# Patient Record
Sex: Female | Born: 1995 | Race: Black or African American | Hispanic: No | Marital: Single | Smoking: Never smoker
Health system: Southern US, Community
[De-identification: ages and names within clinical notes are randomized; demographics above are authoritative.]

---

## 2019-06-10 ENCOUNTER — Emergency Department (HOSPITAL_COMMUNITY)
Admission: EM | Admit: 2019-06-10 | Discharge: 2019-06-10 | Disposition: A | Discharge status: Home / Self Care | Payer: Self-pay | Attending: Emergency Medicine | Admitting: Emergency Medicine

## 2019-06-10 ENCOUNTER — Other Ambulatory Visit: Payer: Self-pay

## 2019-06-10 DIAGNOSIS — N898 Other specified noninflammatory disorders of vagina: Secondary | ICD-10-CM | POA: Insufficient documentation

## 2019-06-10 LAB — WET PREP, GENITAL
Clue Cells Wet Prep HPF POC: NONE SEEN
Sperm: NONE SEEN
Trich, Wet Prep: NONE SEEN
Yeast Wet Prep HPF POC: NONE SEEN

## 2019-06-10 LAB — POC URINE PREG, ED: Preg Test, Ur: NEGATIVE

## 2019-06-10 NOTE — ED Provider Notes (Signed)
[facility] EMERGENCY DEPARTMENT Provider Note   CSN: [id number] Arrival date & time: 06/10/19  1349    History   Chief Complaint No chief complaint on file.   HPI Ashley Hays is a 23 y.o. female.     23yo female with vaginal dc x 2 weeks, odor for the past few days. LMP 1 month ago. Denies pelvic pain. Patient has a history of prior STI, possibly trich within the last 6 months. Patient is sexually active, not on birth control, does not use condoms.      No past medical history on file.  There are no active problems to display for this patient.  OB History   No obstetric history on file.      Home Medications    Prior to Admission medications   Not on File    Family History No family history on file.  Social History Social History   Tobacco Use  . Smoking status: Not on file  Substance Use Topics  . Alcohol use: Not on file  . Drug use: Not on file     Allergies   Patient has no known allergies.   Review of Systems Review of Systems  Constitutional: Negative for fever.  Gastrointestinal: Negative for abdominal pain, constipation, diarrhea, nausea and vomiting.  Genitourinary: Positive for vaginal discharge. Negative for dysuria, frequency, pelvic pain, urgency, vaginal bleeding and vaginal pain.  Skin: Negative for rash and wound.  Allergic/Immunologic: Negative for immunocompromised state.  Neurological: Negative for weakness.  Psychiatric/Behavioral: Negative for confusion.  All other systems reviewed and are negative.    Physical Exam Updated Vital Signs BP (!) 110/91 (BP Location: Right Arm)   Pulse 99   Temp 98.9 F (37.2 C) (Oral)   Resp 14   SpO2 100%   Physical Exam Vitals signs and nursing note reviewed. Exam conducted with a chaperone present.  Constitutional:      General: She is not in acute distress.    Appearance: She is well-developed. She is not diaphoretic.  HENT:     Head: Normocephalic and  atraumatic.  Cardiovascular:     Rate and Rhythm: Normal rate and regular rhythm.     Pulses: Normal pulses.     Heart sounds: Normal heart sounds.  Pulmonary:     Effort: Pulmonary effort is normal.     Breath sounds: Normal breath sounds.  Abdominal:     Palpations: Abdomen is soft.     Tenderness: There is no abdominal tenderness.  Genitourinary:    General: Normal vulva.     Vagina: Normal. No vaginal discharge or tenderness.     Cervix: No cervical motion tenderness, discharge, friability or erythema.     Uterus: Normal. Not enlarged and not tender.      Adnexa: Right adnexa normal and left adnexa normal.       Right: No mass, tenderness or fullness.         Left: No mass, tenderness or fullness.    Skin:    General: Skin is warm and dry.     Findings: No erythema or rash.  Neurological:     Mental Status: She is alert and oriented to person, place, and time.  Psychiatric:        Behavior: Behavior normal.      ED Treatments / Results  Labs (all labs ordered are listed, but only abnormal results are displayed) Labs Reviewed  WET PREP, GENITAL - Abnormal; Notable for the following components:  Result Value   WBC, Wet Prep HPF POC FEW (*)    All other components within normal limits  POC URINE PREG, ED  GC/CHLAMYDIA PROBE AMP ([place]) NOT AT [facility]    EKG None  Radiology No results found.  Procedures Procedures (including critical care time)  Medications Ordered in ED Medications - No data to display   Initial Impression / Assessment and Plan / ED Course  I have reviewed the triage vital signs and the nursing notes.  Pertinent labs & imaging results that were available during my care of the patient were reviewed by me and considered in my medical decision making (see chart for details).  Clinical Course as of Jun 09 1542  Tue Jun 10, 2019  [id number] 23-year-old female with complaint of vaginal discharge with odor.  Pelvic exam is unremarkable.  Wet  prep negative for trichomoniasis, BV, yeast.  Pregnancy test is negative.  Gonorrhea chlamydia testing sent out, patient was advised to contact for results, follow-up with health department if she needs treatment.   [LM]    Clinical Course User Index [LM] Jeannie FendMurphy, Quay Simkin A, PA-C      Final Clinical Impressions(s) / ED Diagnoses   Final diagnoses:  Vaginal discharge    ED Discharge Orders    None       Alden HippMurphy, Shatoria Stooksbury A, PA-C 06/10/19 1543    Arby BarrettePfeiffer, Marcy, MD 06/15/19 [phone]

## 2019-06-10 NOTE — ED Triage Notes (Signed)
Pt here with c/o vaginal discharge , no bleeding noted , no frequency or burning on urination , pt states that she has had 5 negative pernancy test

## 2019-06-10 NOTE — Discharge Instructions (Addendum)
Abstain from intercourse until you know your test results. Call the hospital in 3-5 days for your results. IF your gonorrhea and chlamydia tests are negative, you may resume intercourse. IF either test is positive, your partner will need to go to the health department for free treatment. IF your test is positive, both partners need to abstain from intercourse for 10 days after treatment (this includes all forms of intercourse).  IF your test is positive, you should go to the health department for treatment.

## 2019-06-11 LAB — GC/CHLAMYDIA PROBE AMP (~~LOC~~) NOT AT ARMC
Chlamydia: NEGATIVE
Neisseria Gonorrhea: NEGATIVE

## 2021-06-16 DIAGNOSIS — Z8349 Family history of other endocrine, nutritional and metabolic diseases: Secondary | ICD-10-CM | POA: Insufficient documentation

## 2021-09-12 ENCOUNTER — Emergency Department (HOSPITAL_COMMUNITY): Payer: Medicaid Other

## 2021-09-12 ENCOUNTER — Emergency Department (HOSPITAL_COMMUNITY)
Admission: EM | Admit: 2021-09-12 | Discharge: 2021-09-12 | Disposition: A | Discharge status: Home / Self Care | Payer: Medicaid Other | Attending: Emergency Medicine | Admitting: Emergency Medicine

## 2021-09-12 ENCOUNTER — Other Ambulatory Visit: Payer: Self-pay

## 2021-09-12 DIAGNOSIS — O26891 Other specified pregnancy related conditions, first trimester: Secondary | ICD-10-CM

## 2021-09-12 DIAGNOSIS — R1032 Left lower quadrant pain: Secondary | ICD-10-CM | POA: Diagnosis not present

## 2021-09-12 DIAGNOSIS — O209 Hemorrhage in early pregnancy, unspecified: Secondary | ICD-10-CM | POA: Diagnosis present

## 2021-09-12 DIAGNOSIS — Z3A01 Less than 8 weeks gestation of pregnancy: Secondary | ICD-10-CM | POA: Diagnosis not present

## 2021-09-12 DIAGNOSIS — R102 Pelvic and perineal pain: Secondary | ICD-10-CM | POA: Insufficient documentation

## 2021-09-12 LAB — CBC WITH DIFFERENTIAL/PLATELET
Abs Immature Granulocytes: 0.01 10*3/uL (ref 0.00–0.07)
Basophils Absolute: 0 10*3/uL (ref 0.0–0.1)
Basophils Relative: 1 %
Eosinophils Absolute: 0.2 10*3/uL (ref 0.0–0.5)
Eosinophils Relative: 3 %
HCT: 36.7 % (ref 36.0–46.0)
Hemoglobin: 12 g/dL (ref 12.0–15.0)
Immature Granulocytes: 0 %
Lymphocytes Relative: 38 %
Lymphs Abs: 2.1 10*3/uL (ref 0.7–4.0)
MCH: 27.4 pg (ref 26.0–34.0)
MCHC: 32.7 g/dL (ref 30.0–36.0)
MCV: 83.8 fL (ref 80.0–100.0)
Monocytes Absolute: 0.4 10*3/uL (ref 0.1–1.0)
Monocytes Relative: 7 %
Neutro Abs: 2.7 10*3/uL (ref 1.7–7.7)
Neutrophils Relative %: 51 %
Platelets: 177 10*3/uL (ref 150–400)
RBC: 4.38 MIL/uL (ref 3.87–5.11)
RDW: 12.5 % (ref 11.5–15.5)
WBC: 5.3 10*3/uL (ref 4.0–10.5)
nRBC: 0 % (ref 0.0–0.2)

## 2021-09-12 LAB — COMPREHENSIVE METABOLIC PANEL
ALT: 9 U/L (ref 0–44)
AST: 18 U/L (ref 15–41)
Albumin: 4.3 g/dL (ref 3.5–5.0)
Alkaline Phosphatase: 47 U/L (ref 38–126)
Anion gap: 6 (ref 5–15)
BUN: 10 mg/dL (ref 6–20)
CO2: 24 mmol/L (ref 22–32)
Calcium: 9.7 mg/dL (ref 8.9–10.3)
Chloride: 109 mmol/L (ref 98–111)
Creatinine, Ser: 0.72 mg/dL (ref 0.44–1.00)
GFR, Estimated: >60 mL/min (ref >60)
Glucose, Bld: 97 mg/dL (ref 70–99)
Potassium: 3.4 mmol/L — ABNORMAL LOW (ref 3.5–5.1)
Sodium: 139 mmol/L (ref 135–145)
Total Bilirubin: 0.5 mg/dL (ref 0.3–1.2)
Total Protein: 7.3 g/dL (ref 6.5–8.1)

## 2021-09-12 LAB — URINALYSIS, ROUTINE W REFLEX MICROSCOPIC
Bilirubin Urine: NEGATIVE
Glucose, UA: NEGATIVE mg/dL
Hgb urine dipstick: NEGATIVE
Ketones, ur: 5 mg/dL — AB
Nitrite: NEGATIVE
Protein, ur: NEGATIVE mg/dL
Specific Gravity, Urine: 1.026 (ref 1.005–1.030)
pH: 6 (ref 5.0–8.0)

## 2021-09-12 LAB — HCG, QUANTITATIVE, PREGNANCY: hCG, Beta Chain, Quant, S: 486 m[IU]/mL — ABNORMAL HIGH (ref <5)

## 2021-09-12 LAB — ABO/RH: ABO/RH(D): B POS

## 2021-09-12 NOTE — ED Provider Notes (Signed)
[facility]-EMERGENCY DEPT Provider Note   CSN: [id number] Arrival date & time: 09/12/21  7616     History Chief Complaint  Patient presents with   Side Pain   Pregnancy    Ashley Hays is a 25 y.o. female.  W7([id number]) who presents to the emergency department with left lower quadrant abdominal pain.  States that she has had nonradiating, intermittent, stabbing left lower quadrant pain since Saturday.  Denies aggravating factors.  States that splinting the area during pain makes it better.  Has not tried anything for the pain.  States that she looked up online this pain with pregnancy and was concerned about ectopic.  Denies fevers, abdominal cramping, vaginal bleeding, vaginal discharge, urinary symptoms, nausea/vomiting/diarrhea.  Unsure LMP.  States that it was early September.  She had a pregnancy in January that miscarried.  States at that time she had abdominal cramping and vaginal bleeding.  She states she also had a pregnancy " over the summer" but miscarried then as well.  Does not take birth control.  No previous abdominal surgeries.  HPI     No past medical history on file.  There are no problems to display for this patient.   OB History   No obstetric history on file.     No family history on file.   Home Medications Prior to Admission medications   Not on File   Allergies    Patient has no known allergies.  Review of Systems   Review of Systems  Constitutional:  Negative for fever.  Gastrointestinal:  Negative for nausea and vomiting.  Genitourinary:  Positive for pelvic pain. Negative for dysuria, vaginal bleeding, vaginal discharge and vaginal pain.  Neurological:  Negative for dizziness.  All other systems reviewed and are negative.  Physical Exam Updated Vital Signs BP (!) 145/96 (BP Location: Right Arm)   Pulse (!) 110   Temp 99 F (37.2 C) (Oral)   Resp 18   Ht 5\' 7"  (1.702 m)   Wt 68.9 kg   SpO2 100%   BMI 23.81  kg/m   Physical Exam Vitals and nursing note reviewed.  Constitutional:      General: She is not in acute distress.    Appearance: Normal appearance. She is not toxic-appearing.  HENT:     Head: Normocephalic and atraumatic.     Mouth/Throat:     Mouth: Mucous membranes are moist.     Pharynx: Oropharynx is clear.  Eyes:     General: No scleral icterus.    Extraocular Movements: Extraocular movements intact.     Pupils: Pupils are equal, round, and reactive to light.  Cardiovascular:     Rate and Rhythm: Normal rate and regular rhythm.     Pulses: Normal pulses.     Heart sounds: Normal heart sounds. No murmur heard. Pulmonary:     Effort: Pulmonary effort is normal. No respiratory distress.     Breath sounds: Normal breath sounds.  Abdominal:     General: Bowel sounds are normal. There is no distension.     Palpations: Abdomen is soft.     Tenderness: There is no abdominal tenderness. There is no right CVA tenderness or left CVA tenderness.  Musculoskeletal:     Cervical back: Normal range of motion.  Skin:    General: Skin is warm and dry.     Capillary Refill: Capillary refill takes less than 2 seconds.  Neurological:     General: No focal deficit present.  Mental Status: She is alert and oriented to person, place, and time. Mental status is at baseline.  Psychiatric:        Mood and Affect: Mood normal.        Behavior: Behavior normal.    ED Results / Procedures / Treatments   Labs (all labs ordered are listed, but only abnormal results are displayed) Labs Reviewed  HCG, QUANTITATIVE, PREGNANCY - Abnormal; Notable for the following components:      Result Value   hCG, Beta Chain, Quant, S 486 (*)    All other components within normal limits  URINALYSIS, ROUTINE W REFLEX MICROSCOPIC - Abnormal; Notable for the following components:   Ketones, ur 5 (*)    Leukocytes,Ua TRACE (*)    Bacteria, UA RARE (*)    All other components within normal limits  CBC WITH  DIFFERENTIAL/PLATELET  COMPREHENSIVE METABOLIC PANEL  ABO/RH   EKG None  Radiology No results found.  Procedures Procedures   Medications Ordered in ED Medications - No data to display  ED Course  I have reviewed the triage vital signs and the nursing notes.  Pertinent labs & imaging results that were available during my care of the patient were reviewed by me and considered in my medical decision making (see chart for details).  MDM Rules/Calculators/A&P 25 year old female presents with left lower quadrant abdominal pain in pregnancy.  No vaginal bleeding or cramping.  Labs unremarkable.  hCG quant 486. Obtain imaging to rule out ectopic. Rh+ so will not give RhoGAM given that she is not having bleeding at this time. At time of handoff to Dr. Bernette Mayers awaiting imaging results.  If those are negative she can be discharged with OB/GYN follow-up. Final Clinical Impression(s) / ED Diagnoses Final diagnoses:  None    Rx / DC Orders ED Discharge Orders     None        Cristopher Peru, PA-C 09/12/21 1751    Pollyann Savoy, MD 09/12/21 1426

## 2021-09-12 NOTE — ED Provider Notes (Signed)
Dr. Dorothey Baseman, Radiology: Called with findings from ultrasound. States she has no IUP. There is a solid area that is distinct from the right ovary within the right adnexa that raises suspician for ectopic. There is no internal blood flow to mass. She does not think re-imaging will be beneficial.   Read communicated to Dr. Bernette Mayers who is primarily treating patient at this time.    Cristopher Peru, PA-C 09/12/21 1058    Pollyann Savoy, MD 09/12/21 [phone]

## 2021-09-12 NOTE — ED Triage Notes (Addendum)
Pt complains of left sided pain x 2 days. Pt reports taking 8 pregnancy tests which resulted in a positive. Pt reports having a period last month but does not know the day.

## 2021-09-12 NOTE — ED Notes (Signed)
Assumed care of pt at this time.  Pt given a warm blanket.

## 2021-09-12 NOTE — ED Provider Notes (Signed)
Patient seen and examined, agree with assessment and plan by APP. Patient with L sided pelvic pain, has had similar before but just recently found out she is pregnant <4wks. Sharene Butters is consistent with dates. US shows a cystic structure but it is in the R side, so doubt this is an ectopic pregnancy. I spoke with Dr. Crissie Reese, Ob/Gyn who recommend close outpatient follow up at [facility] in 2 days for recheck quant. Patient is amenable to this plan. Advised to go to [facility] if any further symptoms start.    Pollyann Savoy, MD 09/12/21 [phone]

## 2021-09-12 NOTE — ED Notes (Addendum)
Lavender and dark green tube sent to lab.

## 2021-09-14 ENCOUNTER — Ambulatory Visit (INDEPENDENT_AMBULATORY_CARE_PROVIDER_SITE_OTHER): Payer: Medicaid Other

## 2021-09-14 ENCOUNTER — Other Ambulatory Visit: Payer: Self-pay

## 2021-09-14 VITALS — BP 128/91 | HR 103 | Wt 153.4 lb

## 2021-09-14 DIAGNOSIS — O3680X0 Pregnancy with inconclusive fetal viability, not applicable or unspecified: Secondary | ICD-10-CM

## 2021-09-14 LAB — BETA HCG QUANT (REF LAB): hCG Quant: 1919 m[IU]/mL

## 2021-09-14 NOTE — Progress Notes (Signed)
Chart reviewed for nurse visit. Agree with plan of care.   Arne Schlender M, MD 09/14/21 9:46 PM 

## 2021-09-14 NOTE — Progress Notes (Signed)
Beta HCG Follow-up Visit  Ashley Hays presents to [facility] for follow-up beta HCG lab. She was seen in ED for abdominal pain on 09/12/21. Patient denies pain or bleeding today. Discussed with patient that we are following beta HCG levels today. Results will be back in approximately 2 hours. Valid contact number for patient confirmed. I will call the patient with results.  Beta HCG results: 09/12/21 0758 486  09/14/21 0942 1919   Results and patient history reviewed with Crissie Reese, MD who states this is an appropriate rise in HCG and pt should return for [name] in 14 days. Patient called and informed of plan for follow-up. [name] scheduled for 09/28/21 at 9:00 AM; pt to arrive at 0845 with a full bladder. Return precautions reviewed.  Marjo Bicker 09/14/2021 9:22 AM

## 2021-09-18 ENCOUNTER — Emergency Department (HOSPITAL_COMMUNITY)
Admission: EM | Admit: 2021-09-18 | Discharge: 2021-09-19 | Disposition: A | Discharge status: Home / Self Care | Payer: Medicaid Other | Attending: Emergency Medicine | Admitting: Emergency Medicine

## 2021-09-18 DIAGNOSIS — N9489 Other specified conditions associated with female genital organs and menstrual cycle: Secondary | ICD-10-CM | POA: Diagnosis not present

## 2021-09-18 DIAGNOSIS — R102 Pelvic and perineal pain: Secondary | ICD-10-CM | POA: Insufficient documentation

## 2021-09-19 ENCOUNTER — Other Ambulatory Visit: Payer: Self-pay

## 2021-09-19 ENCOUNTER — Emergency Department (HOSPITAL_COMMUNITY): Payer: Medicaid Other

## 2021-09-19 ENCOUNTER — Encounter (HOSPITAL_COMMUNITY): Payer: Self-pay

## 2021-09-19 LAB — COMPREHENSIVE METABOLIC PANEL
ALT: 12 U/L (ref 0–44)
AST: 20 U/L (ref 15–41)
Albumin: 4.1 g/dL (ref 3.5–5.0)
Alkaline Phosphatase: 45 U/L (ref 38–126)
Anion gap: 4 — ABNORMAL LOW (ref 5–15)
BUN: 7 mg/dL (ref 6–20)
CO2: 27 mmol/L (ref 22–32)
Calcium: 9.4 mg/dL (ref 8.9–10.3)
Chloride: 105 mmol/L (ref 98–111)
Creatinine, Ser: 0.81 mg/dL (ref 0.44–1.00)
GFR, Estimated: >60 mL/min (ref >60)
Glucose, Bld: 89 mg/dL (ref 70–99)
Potassium: 3.7 mmol/L (ref 3.5–5.1)
Sodium: 136 mmol/L (ref 135–145)
Total Bilirubin: 0.5 mg/dL (ref 0.3–1.2)
Total Protein: 7.3 g/dL (ref 6.5–8.1)

## 2021-09-19 LAB — CBC
HCT: 35.7 % — ABNORMAL LOW (ref 36.0–46.0)
Hemoglobin: 11.7 g/dL — ABNORMAL LOW (ref 12.0–15.0)
MCH: 27.5 pg (ref 26.0–34.0)
MCHC: 32.8 g/dL (ref 30.0–36.0)
MCV: 83.8 fL (ref 80.0–100.0)
Platelets: 178 10*3/uL (ref 150–400)
RBC: 4.26 MIL/uL (ref 3.87–5.11)
RDW: 12.9 % (ref 11.5–15.5)
WBC: 4.9 10*3/uL (ref 4.0–10.5)
nRBC: 0 % (ref 0.0–0.2)

## 2021-09-19 LAB — I-STAT BETA HCG BLOOD, ED (MC, WL, AP ONLY): I-stat hCG, quantitative: >2000 m[IU]/mL — ABNORMAL HIGH (ref <5)

## 2021-09-19 MED ORDER — ACETAMINOPHEN 500 MG PO TABS
1000.0000 mg | ORAL_TABLET | Freq: Once | ORAL | Status: AC
  Administered 2021-09-19: 1000 mg via ORAL
  Filled 2021-09-19: qty 2

## 2021-09-19 NOTE — ED Triage Notes (Signed)
Patient was here a week ago and is still having the same pain left lower pelvic area. Patient is 4 weeks 5 days pregnant. No vaginal discharge. Pain is coming and going.

## 2021-09-19 NOTE — ED Provider Notes (Signed)
[facility]-EMERGENCY DEPT Provider Note   CSN: [id number] Arrival date & time: 09/18/21  2352     History Chief Complaint  Patient presents with   Pelvic Pain    Ashley Hays is a 25 y.o. female.  25 year old female who was last menstrual period was September 13.  She is G3, P0 at this point without a confirmed intrauterine pregnancy presents for persistent left pelvic pain.  Patient states that she did not have vaginal discharge, bleeding or leakage of fluid.  She has not take anything for her pain because she is worried about causing of the miscarriage like she has had in the past. No vomiting. No diarrhea.   Pelvic Pain      History reviewed. No pertinent past medical history.  There are no problems to display for this patient.   History reviewed. No pertinent surgical history.   OB History     Gravida  2   Para  0   Term  0   Preterm  0   AB  1   Living  0      SAB  1   IAB  0   Ectopic  0   Multiple  0   Live Births  0           History reviewed. No pertinent family history.  Social History   Tobacco Use   Smoking status: Never   Smokeless tobacco: Never  Vaping Use   Vaping Use: Never used  Substance Use Topics   Alcohol use: Not Currently   Drug use: Not Currently    Types: Marijuana    Home Medications Prior to Admission medications   Not on File    Allergies    Patient has no known allergies.  Review of Systems   Review of Systems  Genitourinary:  Positive for pelvic pain.  All other systems reviewed and are negative.  Physical Exam Updated Vital Signs BP 125/84   Pulse (!) 105   Temp 98.9 F (37.2 C) (Oral)   Resp 16   SpO2 98%   Physical Exam Vitals and nursing note reviewed.  Constitutional:      Appearance: She is well-developed.  HENT:     Head: Normocephalic and atraumatic.     Mouth/Throat:     Mouth: Mucous membranes are moist.     Pharynx: Oropharynx is clear.   Eyes:     Pupils: Pupils are equal, round, and reactive to light.  Cardiovascular:     Rate and Rhythm: Normal rate and regular rhythm.  Pulmonary:     Effort: No respiratory distress.     Breath sounds: No stridor.  Abdominal:     General: Abdomen is flat. There is no distension.  Musculoskeletal:     Cervical back: Normal range of motion.  Skin:    General: Skin is warm and dry.  Neurological:     General: No focal deficit present.     Mental Status: She is alert.    ED Results / Procedures / Treatments   Labs (all labs ordered are listed, but only abnormal results are displayed) Labs Reviewed  CBC - Abnormal; Notable for the following components:      Result Value   Hemoglobin 11.7 (*)    HCT 35.7 (*)    All other components within normal limits  COMPREHENSIVE METABOLIC PANEL - Abnormal; Notable for the following components:   Anion gap 4 (*)    All other components within  normal limits  I-STAT BETA HCG BLOOD, ED ([facility], [facility], [facility] ONLY) - Abnormal; Notable for the following components:   I-stat hCG, quantitative >2,000.0 (*)    All other components within normal limits    EKG None  Radiology US OB LESS THAN 14 WEEKS WITH OB TRANSVAGINAL  Result Date: 09/19/2021 CLINICAL DATA:  Pelvic pain EXAM: OBSTETRIC <14 WK [name] AND TRANSVAGINAL OB US TECHNIQUE: Both transabdominal and transvaginal ultrasound examinations were performed for complete evaluation of the gestation as well as the maternal uterus, adnexal regions, and pelvic cul-de-sac. Transvaginal technique was performed to assess early pregnancy. COMPARISON:  None. FINDINGS: Intrauterine gestational sac: Single Yolk sac:  Not Visualized. Embryo:  Not Visualized. MSD: 6.2  mm   5 w   2  d Subchorionic hemorrhage:  None visualized. Maternal uterus/adnexae: Ring like structure in the left adnexa measuring up to 3.1 cm is definitely intra-ovarian and consistent with corpus luteum IMPRESSION: Probable early intrauterine gestational  sac, but no yolk sac or fetal pole. Recommend follow-up quantitative B-HCG levels and follow-up US in 14 days to assess viability. This recommendation follows SRU consensus guidelines: Diagnostic Criteria for Nonviable Pregnancy Early in the First Trimester. Malva Limes Med 2013; 676:7209-47. Electronically Signed   By: Tiburcio Pea M.D.   On: 09/19/2021 04:41    Procedures Procedures   Medications Ordered in ED Medications  acetaminophen (TYLENOL) tablet 1,000 mg (1,000 mg Oral Given 09/19/21 0427)    ED Course  I have reviewed the triage vital signs and the nursing notes.  Pertinent labs & imaging results that were available during my care of the patient were reviewed by me and considered in my medical decision making (see chart for details).    MDM Rules/Calculators/A&P                         No definitive IUP at this time but will fu w/ ob for repeat US and HCG's.   Final Clinical Impression(s) / ED Diagnoses Final diagnoses:  Pelvic pain    Rx / DC Orders ED Discharge Orders     None        Hazely Sealey, Barbara Cower, MD 09/19/21 [phone]

## 2021-09-28 ENCOUNTER — Ambulatory Visit: Admission: RE | Admit: 2021-09-28 | Payer: Medicaid Other | Source: Ambulatory Visit

## 2021-10-04 ENCOUNTER — Telehealth: Payer: Self-pay

## 2021-10-04 ENCOUNTER — Ambulatory Visit
Admission: RE | Admit: 2021-10-04 | Discharge: 2021-10-04 | Disposition: A | Discharge status: Home / Self Care | Payer: Medicaid Other | Source: Ambulatory Visit | Attending: Obstetrics and Gynecology | Admitting: Obstetrics and Gynecology

## 2021-10-04 ENCOUNTER — Other Ambulatory Visit: Payer: Self-pay

## 2021-10-04 DIAGNOSIS — O3680X0 Pregnancy with inconclusive fetal viability, not applicable or unspecified: Secondary | ICD-10-CM | POA: Insufficient documentation

## 2021-10-04 NOTE — Telephone Encounter (Signed)
I called Ashley Hays today at 11:52 AM and confirmed patient's identity using two patient identifiers. [name] results from earlier today were reviewed. Patient is not yet scheduled for new OB visit. Plans to go to [facility]. First trimester warning signs reviewed. Patient voiced understanding and had no further questions.   US OB Transvaginal  Result Date: 10/04/2021 CLINICAL DATA:  Pregnancy of unknown anatomic location. EXAM: TRANSVAGINAL OB ULTRASOUND TECHNIQUE: Transvaginal ultrasound was performed for complete evaluation of the gestation as well as the maternal uterus, adnexal regions, and pelvic cul-de-sac. COMPARISON:  None. FINDINGS: Intrauterine gestational sac: Single Yolk sac:  Visualized Embryo:  Visualized Cardiac Activity: Visualized Heart Rate: 135 bpm CRL:   10.7 mm   7 w 1 d                  [name] EDC: 05/22/2022 Subchorionic hemorrhage:  None visualized. Maternal uterus/adnexae: Right ovary: Normal Left ovary: Normal containing corpus luteum. Other :Gestational sac appears large for gestational age. Free fluid:  Trace free fluid noted. IMPRESSION: 1. Single living intrauterine gestation with estimated gestational age of [redacted] weeks and 1 day. 2. Gestational sac appears large for gestational age. Attention on follow-up imaging advised. Electronically Signed   By: Signa Kell M.D.   On: 10/04/2021 11:45    Rolm Bookbinder, CNM 10/04/2021 11:52 AM

## 2021-10-19 ENCOUNTER — Telehealth: Payer: Medicaid Other

## 2021-10-19 ENCOUNTER — Telehealth: Payer: Self-pay

## 2021-10-19 NOTE — Telephone Encounter (Signed)
Called Pt to start New OB Intake, no answer, phones goes straight to busy signal. Pt also lives in [place].

## 2021-11-02 ENCOUNTER — Encounter: Payer: Self-pay | Admitting: Family Medicine

## 2021-11-02 ENCOUNTER — Other Ambulatory Visit (HOSPITAL_COMMUNITY)
Admission: RE | Admit: 2021-11-02 | Discharge: 2021-11-02 | Disposition: A | Discharge status: Home / Self Care | Payer: Medicaid Other | Source: Ambulatory Visit | Attending: Family Medicine | Admitting: Family Medicine

## 2021-11-02 ENCOUNTER — Other Ambulatory Visit: Payer: Self-pay

## 2021-11-02 ENCOUNTER — Ambulatory Visit (INDEPENDENT_AMBULATORY_CARE_PROVIDER_SITE_OTHER): Payer: Medicaid Other | Admitting: Family Medicine

## 2021-11-02 VITALS — BP 123/74 | HR 123 | Wt 156.7 lb

## 2021-11-02 DIAGNOSIS — Z3490 Encounter for supervision of normal pregnancy, unspecified, unspecified trimester: Secondary | ICD-10-CM | POA: Diagnosis not present

## 2021-11-02 DIAGNOSIS — O98811 Other maternal infectious and parasitic diseases complicating pregnancy, first trimester: Secondary | ICD-10-CM

## 2021-11-02 DIAGNOSIS — A749 Chlamydial infection, unspecified: Secondary | ICD-10-CM | POA: Insufficient documentation

## 2021-11-02 DIAGNOSIS — N870 Mild cervical dysplasia: Secondary | ICD-10-CM

## 2021-11-02 DIAGNOSIS — R768 Other specified abnormal immunological findings in serum: Secondary | ICD-10-CM | POA: Insufficient documentation

## 2021-11-02 LAB — POCT URINALYSIS DIP (DEVICE)
Bilirubin Urine: NEGATIVE
Glucose, UA: NEGATIVE mg/dL
Hgb urine dipstick: NEGATIVE
Ketones, ur: NEGATIVE mg/dL
Leukocytes,Ua: NEGATIVE
Nitrite: NEGATIVE
Protein, ur: NEGATIVE mg/dL
Specific Gravity, Urine: >=1.03 (ref 1.005–1.030)
Urobilinogen, UA: 0.2 mg/dL (ref 0.0–1.0)
pH: 7 (ref 5.0–8.0)

## 2021-11-02 MED ORDER — BLOOD PRESSURE KIT DEVI: 1.0000 | 0 refills | Status: AC | PRN

## 2021-11-02 MED ORDER — GOJJI WEIGHT SCALE MISC: 1.0000 | 0 refills | Status: AC | PRN

## 2021-11-02 NOTE — Patient Instructions (Signed)
Second Trimester of Pregnancy °The second trimester of pregnancy is from week 13 through week 27. This is months 4 through 6 of pregnancy. The second trimester is often a time when you feel your best. Your body has adjusted to being pregnant, and you begin to feel better physically. °During the second trimester: °Morning sickness has lessened or stopped completely. °You may have more energy. °You may have an increase in appetite. °The second trimester is also a time when the unborn baby (fetus) is growing rapidly. At the end of the sixth month, the fetus may be up to 12 inches long and weigh about 1½ pounds. You will likely begin to feel the baby move (quickening) between 16 and 20 weeks of pregnancy. °Body changes during your second trimester °Your body continues to go through many changes during your second trimester. The changes vary and generally return to normal after the baby is born. °Physical changes °Your weight will continue to increase. You will notice your lower abdomen bulging out. °You may begin to get stretch marks on your hips, abdomen, and breasts. °Your breasts will continue to grow and to become tender. °Dark spots or blotches (chloasma or mask of pregnancy) may develop on your face. °A dark line from your belly button to the pubic area (linea nigra) may appear. °You may have changes in your hair. These can include thickening of your hair, rapid growth, and changes in texture. Some people also have hair loss during or after pregnancy, or hair that feels dry or thin. °Health changes °You may develop headaches. °You may have heartburn. °You may develop constipation. °You may develop hemorrhoids or swollen, bulging veins (varicose veins). °Your gums may bleed and may be sensitive to brushing and flossing. °You may urinate more often because the fetus is pressing on your bladder. °You may have back pain. This is caused by: °Weight gain. °Pregnancy hormones that are relaxing the joints in your  pelvis. °A shift in weight and the muscles that support your balance. °Follow these instructions at home: °Medicines °Follow your health care provider's instructions regarding medicine use. Specific medicines may be either safe or unsafe to take during pregnancy. Do not take any medicines unless approved by your health care provider. °Take a prenatal vitamin that contains at least 600 micrograms (mcg) of folic acid. °Eating and drinking °Eat a healthy diet that includes fresh fruits and vegetables, whole grains, good sources of protein such as meat, eggs, or tofu, and low-fat dairy products. °Avoid raw meat and unpasteurized juice, milk, and cheese. These carry germs that can harm you and your baby. °You may need to take these actions to prevent or treat constipation: °Drink enough fluid to keep your urine pale yellow. °Eat foods that are high in fiber, such as beans, whole grains, and fresh fruits and vegetables. °Limit foods that are high in fat and processed sugars, such as fried or sweet foods. °Activity °Exercise only as directed by your health care provider. Most people can continue their usual exercise routine during pregnancy. Try to exercise for 30 minutes at least 5 days a week. Stop exercising if you develop contractions in your uterus. °Stop exercising if you develop pain or cramping in the lower abdomen or lower back. °Avoid exercising if it is very hot or humid or if you are at a high altitude. °Avoid heavy lifting. °If you choose to, you may have sex unless your health care provider tells you not to. °Relieving pain and discomfort °Wear a supportive bra   to prevent discomfort from breast tenderness. °Take warm sitz baths to soothe any pain or discomfort caused by hemorrhoids. Use hemorrhoid cream if your health care provider approves. °Rest with your legs raised (elevated) if you have leg cramps or low back pain. °If you develop varicose veins: °Wear support hose as told by your health care  provider. °Elevate your feet for 15 minutes, 3-4 times a day. °Limit salt in your diet. °Safety °Wear your seat belt at all times when driving or riding in a car. °Talk with your health care provider if someone is verbally or physically abusive to you. °Lifestyle °Do not use hot tubs, steam rooms, or saunas. °Do not douche. Do not use tampons or scented sanitary pads. °Avoid cat litter boxes and soil used by cats. These carry germs that can cause birth defects in the baby and possibly loss of the fetus by miscarriage or stillbirth. °Do not use herbal remedies, alcohol, illegal drugs, or medicines that are not approved by your health care provider. Chemicals in these products can harm your baby. °Do not use any products that contain nicotine or tobacco, such as cigarettes, e-cigarettes, and chewing tobacco. If you need help quitting, ask your health care provider. °General instructions °During a routine prenatal visit, your health care provider will do a physical exam and other tests. He or she will also discuss your overall health. Keep all follow-up visits. This is important. °Ask your health care provider for a referral to a local prenatal education class. °Ask for help if you have counseling or nutritional needs during pregnancy. Your health care provider can offer advice or refer you to specialists for help with various needs. °Where to find more information °American Pregnancy Association: americanpregnancy.org °American College of Obstetricians and Gynecologists: acog.org/en/Womens%20Health/Pregnancy °Office on Women's Health: womenshealth.gov/pregnancy °Contact a health care provider if you have: °A headache that does not go away when you take medicine. °Vision changes or you see spots in front of your eyes. °Mild pelvic cramps, pelvic pressure, or nagging pain in the abdominal area. °Persistent nausea, vomiting, or diarrhea. °A bad-smelling vaginal discharge or foul-smelling urine. °Pain when you  urinate. °Sudden or extreme swelling of your face, hands, ankles, feet, or legs. °A fever. °Get help right away if you: °Have fluid leaking from your vagina. °Have spotting or bleeding from your vagina. °Have severe abdominal cramping or pain. °Have difficulty breathing. °Have chest pain. °Have fainting spells. °Have not felt your baby move for the time period told by your health care provider. °Have new or increased pain, swelling, or redness in an arm or leg. °Summary °The second trimester of pregnancy is from week 13 through week 27 (months 4 through 6). °Do not use herbal remedies, alcohol, illegal drugs, or medicines that are not approved by your health care provider. Chemicals in these products can harm your baby. °Exercise only as directed by your health care provider. Most people can continue their usual exercise routine during pregnancy. °Keep all follow-up visits. This is important. °This information is not intended to replace advice given to you by your health care provider. Make sure you discuss any questions you have with your health care provider. °Document Revised: 04/28/2020 Document Reviewed: 03/04/2020 °Elsevier Patient Education © 2022 Elsevier Inc. ° °Contraception Choices °Contraception, also called birth control, refers to methods or devices that prevent pregnancy. °Hormonal methods °Contraceptive implant °A contraceptive implant is a thin, plastic tube that contains a hormone that prevents pregnancy. It is different from an intrauterine device (IUD). It   is inserted into the upper part of the arm by a health care provider. Implants can be effective for up to 3 years. °Progestin-only injections °Progestin-only injections are injections of progestin, a synthetic form of the hormone progesterone. They are given every 3 months by a health care provider. °Birth control pills °Birth control pills are pills that contain hormones that prevent pregnancy. They must be taken once a day, preferably at the  same time each day. A prescription is needed to use this method of contraception. °Birth control patch °The birth control patch contains hormones that prevent pregnancy. It is placed on the skin and must be changed once a week for three weeks and removed on the fourth week. A prescription is needed to use this method of contraception. °Vaginal ring °A vaginal ring contains hormones that prevent pregnancy. It is placed in the vagina for three weeks and removed on the fourth week. After that, the process is repeated with a new ring. A prescription is needed to use this method of contraception. °Emergency contraceptive °Emergency contraceptives prevent pregnancy after unprotected sex. They come in pill form and can be taken up to 5 days after sex. They work best the sooner they are taken after having sex. Most emergency contraceptives are available without a prescription. This method should not be used as your only form of birth control. °Barrier methods °Female condom °A female condom is a thin sheath that is worn over the penis during sex. Condoms keep sperm from going inside a woman's body. They can be used with a sperm-killing substance (spermicide) to increase their effectiveness. They should be thrown away after one use. °Female condom °A female condom is a soft, loose-fitting sheath that is put into the vagina before sex. The condom keeps sperm from going inside a woman's body. They should be thrown away after one use. °Diaphragm °A diaphragm is a soft, dome-shaped barrier. It is inserted into the vagina before sex, along with a spermicide. The diaphragm blocks sperm from entering the uterus, and the spermicide kills sperm. A diaphragm should be left in the vagina for 6-8 hours after sex and removed within 24 hours. °A diaphragm is prescribed and fitted by a health care provider. A diaphragm should be replaced every 1-2 years, after giving birth, after gaining more than 15 lb (6.8 kg), and after pelvic  surgery. °Cervical cap °A cervical cap is a round, soft latex or plastic cup that fits over the cervix. It is inserted into the vagina before sex, along with spermicide. It blocks sperm from entering the uterus. The cap should be left in place for 6-8 hours after sex and removed within 48 hours. A cervical cap must be prescribed and fitted by a health care provider. It should be replaced every 2 years. °Sponge °A sponge is a soft, circular piece of polyurethane foam with spermicide in it. The sponge helps block sperm from entering the uterus, and the spermicide kills sperm. To use it, you make it wet and then insert it into the vagina. It should be inserted before sex, left in for at least 6 hours after sex, and removed and thrown away within 30 hours. °Spermicides °Spermicides are chemicals that kill or block sperm from entering the cervix and uterus. They can come as a cream, jelly, suppository, foam, or tablet. A spermicide should be inserted into the vagina with an applicator at least 10-15 minutes before sex to allow time for it to work. The process must be repeated every time   you have sex. Spermicides do not require a prescription. °Intrauterine contraception °Intrauterine device (IUD) °An IUD is a T-shaped device that is put in a woman's uterus. There are two types: °Hormone IUD.This type contains progestin, a synthetic form of the hormone progesterone. This type can stay in place for 3-5 years. °Copper IUD.This type is wrapped in copper wire. It can stay in place for 10 years. °Permanent methods of contraception °Female tubal ligation °In this method, a woman's fallopian tubes are sealed, tied, or blocked during surgery to prevent eggs from traveling to the uterus. °Hysteroscopic sterilization °In this method, a small, flexible insert is placed into each fallopian tube. The inserts cause scar tissue to form in the fallopian tubes and block them, so sperm cannot reach an egg. The procedure takes about 3  months to be effective. Another form of birth control must be used during those 3 months. °Female sterilization °This is a procedure to tie off the tubes that carry sperm (vasectomy). After the procedure, the man can still ejaculate fluid (semen). Another form of birth control must be used for 3 months after the procedure. °Natural planning methods °Natural family planning °In this method, a couple does not have sex on days when the woman could become pregnant. °Calendar method °In this method, the woman keeps track of the length of each menstrual cycle, identifies the days when pregnancy can happen, and does not have sex on those days. °Ovulation method °In this method, a couple avoids sex during ovulation. °Symptothermal method °This method involves not having sex during ovulation. The woman typically checks for ovulation by watching changes in her temperature and in the consistency of cervical mucus. °Post-ovulation method °In this method, a couple waits to have sex until after ovulation. °Where to find more information °Centers for Disease Control and Prevention: www.cdc.gov °Summary °Contraception, also called birth control, refers to methods or devices that prevent pregnancy. °Hormonal methods of contraception include implants, injections, pills, patches, vaginal rings, and emergency contraceptives. °Barrier methods of contraception can include female condoms, female condoms, diaphragms, cervical caps, sponges, and spermicides. °There are two types of IUDs (intrauterine devices). An IUD can be put in a woman's uterus to prevent pregnancy for 3-5 years. °Permanent sterilization can be done through a procedure for males and females. Natural family planning methods involve nothaving sex on days when the woman could become pregnant. °This information is not intended to replace advice given to you by your health care provider. Make sure you discuss any questions you have with your health care provider. °Document  Revised: 04/26/2020 Document Reviewed: 04/26/2020 °Elsevier Patient Education © 2022 Elsevier Inc. ° °

## 2021-11-02 NOTE — Progress Notes (Addendum)
Anatomy U/S  scheduled for January 23rd @ 0830.  Pt notified.   Addison Naegeli, RN  11/02/21

## 2021-11-02 NOTE — Progress Notes (Signed)
     Subjective:   Ashley Hays is a 25 y.o. G2P0010 at [redacted]w[redacted]d by early ultrasound being seen today for her first obstetrical visit.  Her obstetrical history is significant for  n/a . Patient  is unsure about  intention to breast feed. Pregnancy history fully reviewed.  Patient reports positive blood test for HSV in the past and is wondering about a C-section.  HISTORY: OB History  Gravida Para Term Preterm AB Living  2 0 0 0 1 0  SAB IAB Ectopic Multiple Live Births  1 0 0 0 0    # Outcome Date GA Lbr Len/2nd Weight Sex Delivery Anes PTL Lv  2 Current           1 SAB 12/2020             Last pap smear: No results found for: DIAGPAP, HPV, HPVHIGH  Results abstracted LSIL with +HRHPV in 02/2021 CIN 1 colpo 03/2021   History reviewed. No pertinent past medical history. History reviewed. No pertinent surgical history. History reviewed. No pertinent family history. Social History   Tobacco Use   Smoking status: Never   Smokeless tobacco: Never  Vaping Use   Vaping Use: Never used  Substance Use Topics   Alcohol use: Not Currently   Drug use: Not Currently    Types: Marijuana    Comment: last use in August   No Known Allergies Current Outpatient Medications on File Prior to Visit  Medication Sig Dispense Refill   Prenatal Vit-Fe Fumarate-FA (PREPLUS) 27-1 MG TABS Take by mouth.     No current facility-administered medications on file prior to visit.     Exam   Vitals:   11/02/21 1008  BP: 123/74  Pulse: (!) 123  Weight: 156 lb 11.2 oz (71.1 kg)   Fetal Heart Rate (bpm): 169  System: General: well-developed, well-nourished female in no acute distress   Skin: normal coloration and turgor, no rashes   Neurologic: oriented, normal, negative, normal mood   Extremities: normal strength, tone, and muscle mass, ROM of all joints is normal   HEENT PERRLA, extraocular movement intact and sclera clear, anicteric   Neck supple and no masses   Respiratory:  no  respiratory distress       Assessment:   Pregnancy: G2P0010 Patient Active Problem List   Diagnosis Date Noted   Supervision of low-risk pregnancy 11/02/2021     Plan:  1. Encounter for supervision of low-risk pregnancy, antepartum Initial labs drawn. Continue prenatal vitamins. Genetic Screening discussed, NIPS: ordered. Ultrasound discussed; fetal anatomic survey: ordered. Problem list reviewed and updated. The nature of [facility] with multiple MDs and other Advanced Practice Providers was explained to patient; also emphasized that residents, students are part of our team.  2. 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 CIN I Colposcopy Had LSIL pap with +HRHPV in March CIN 1 colpo in April Needs postpartum pap    Routine obstetric precautions reviewed. Return in 4 weeks (on 11/30/2021) for [facility], ob visit.

## 2021-11-02 NOTE — Addendum Note (Signed)
Addended by: Faythe Casa on: 11/02/2021 11:05 AM   Modules accepted: Orders

## 2021-11-03 ENCOUNTER — Encounter: Payer: Self-pay | Admitting: *Deleted

## 2021-11-03 LAB — CBC/D/PLT+RPR+RH+ABO+RUBIGG...
Antibody Screen: NEGATIVE
Basophils Absolute: 0 10*3/uL (ref 0.0–0.2)
Basos: 0 %
EOS (ABSOLUTE): 0.1 10*3/uL (ref 0.0–0.4)
Eos: 2 %
HCV Ab: <0.1 s/co ratio (ref 0.0–0.9)
HIV Screen 4th Generation wRfx: NONREACTIVE
Hematocrit: 34.7 % (ref 34.0–46.6)
Hemoglobin: 11.7 g/dL (ref 11.1–15.9)
Hepatitis B Surface Ag: NEGATIVE
Immature Grans (Abs): 0 10*3/uL (ref 0.0–0.1)
Immature Granulocytes: 0 %
Lymphocytes Absolute: 1 10*3/uL (ref 0.7–3.1)
Lymphs: 12 %
MCH: 27.6 pg (ref 26.6–33.0)
MCHC: 33.7 g/dL (ref 31.5–35.7)
MCV: 82 fL (ref 79–97)
Monocytes Absolute: 0.5 10*3/uL (ref 0.1–0.9)
Monocytes: 6 %
Neutrophils Absolute: 7 10*3/uL (ref 1.4–7.0)
Neutrophils: 80 %
Platelets: 177 10*3/uL (ref 150–450)
RBC: 4.24 x10E6/uL (ref 3.77–5.28)
RDW: 13.2 % (ref 11.7–15.4)
RPR Ser Ql: NONREACTIVE
Rh Factor: POSITIVE
Rubella Antibodies, IGG: 2.12 index (ref >0.99)
WBC: 8.8 10*3/uL (ref 3.4–10.8)

## 2021-11-03 LAB — HEMOGLOBIN A1C
Est. average glucose Bld gHb Est-mCnc: 100 mg/dL
Hgb A1c MFr Bld: 5.1 % (ref 4.8–5.6)

## 2021-11-03 LAB — GC/CHLAMYDIA PROBE AMP (~~LOC~~) NOT AT ARMC
Chlamydia: NEGATIVE
Comment: NEGATIVE
Comment: NORMAL
Neisseria Gonorrhea: NEGATIVE

## 2021-11-03 LAB — HCV INTERPRETATION

## 2021-11-04 LAB — CULTURE, OB URINE

## 2021-11-04 LAB — URINE CULTURE, OB REFLEX

## 2021-11-15 ENCOUNTER — Encounter: Payer: Self-pay | Admitting: Family Medicine

## 2021-12-02 ENCOUNTER — Ambulatory Visit (INDEPENDENT_AMBULATORY_CARE_PROVIDER_SITE_OTHER): Payer: Medicaid Other | Admitting: Nurse Practitioner

## 2021-12-02 ENCOUNTER — Other Ambulatory Visit: Payer: Self-pay

## 2021-12-02 VITALS — BP 112/72 | HR 101 | Wt 164.6 lb

## 2021-12-02 DIAGNOSIS — Z3492 Encounter for supervision of normal pregnancy, unspecified, second trimester: Secondary | ICD-10-CM

## 2021-12-02 DIAGNOSIS — Z3A15 15 weeks gestation of pregnancy: Secondary | ICD-10-CM

## 2021-12-02 MED ORDER — PRENATAL PLUS 27-1 MG PO TABS: 1.0000 | ORAL_TABLET | Freq: Every day | ORAL | 11 refills | Status: AC

## 2021-12-02 NOTE — Progress Notes (Signed)
° ° °  Subjective:  Ashley Hays is a 25 y.o. G3P0020 at [redacted]w[redacted]d being seen today for ongoing prenatal care.  She is currently monitored for the following issues for this low-risk pregnancy and has Supervision of low-risk pregnancy; Chlamydia infection affecting pregnancy in first trimester; HSV-2 seropositive; Dysplasia of cervix, low grade (CIN 1); and Family history of diabetes insipidus on their problem list.  Patient reports no complaints.  Contractions: Not present. Vag. Bleeding: None.  Movement: Absent. Denies leaking of fluid.   The following portions of the patient's history were reviewed and updated as appropriate: allergies, current medications, past family history, past medical history, past social history, past surgical history and problem list. Problem list updated.  Objective:   Vitals:   12/02/21 1047  BP: 112/72  Pulse: (!) 101  Weight: 164 lb 9.6 oz (74.7 kg)    Fetal Status: Fetal Heart Rate (bpm): 151   Movement: Absent     General:  Alert, oriented and cooperative. Patient is in no acute distress.  Skin: Skin is warm and dry. No rash noted.   Cardiovascular: Normal heart rate noted  Respiratory: Normal respiratory effort, no problems with respiration noted  Abdomen: Soft, gravid, appropriate for gestational age. Pain/Pressure: Present     Pelvic:  Cervical exam deferred        Extremities: Normal range of motion.  Edema: None  Mental Status: Normal mood and affect. Normal behavior. Normal judgment and thought content.   Urinalysis:      Assessment and Plan:  Pregnancy: G3P0020 at [redacted]w[redacted]d  1. Encounter for supervision of low-risk pregnancy in second trimester Will be delivering in [place] - has OB there and will make an appointment  - prenatal vitamin w/FE, FA (PRENATAL 1 + 1) 27-1 MG TABS tablet; Take 1 tablet by mouth daily at 12 noon.  Dispense: 30 tablet; Refill: 11 - AFP, Serum, Open Spina Bifida  2. [redacted] weeks gestation of pregnancy   Preterm labor  symptoms and general obstetric precautions including but not limited to vaginal bleeding, contractions, leaking of fluid and fetal movement were reviewed in detail with the patient. Please refer to After Visit Summary for other counseling recommendations.  Return in about 4 weeks (around 12/30/2021).  Nolene Bernheim, RN, MSN, NP-BC Nurse Practitioner, [facility] for [employer], [facility] 12/02/2021 12:16 PM

## 2021-12-04 LAB — AFP, SERUM, OPEN SPINA BIFIDA
AFP MoM: 0.85
AFP Value: 27 ng/mL
Gest. Age on Collection Date: 15 weeks
Maternal Age At EDD: 24.1 yr
OSBR Risk 1 IN: 10000
Test Results:: NEGATIVE
Weight: 165 [lb_av]

## 2021-12-19 ENCOUNTER — Other Ambulatory Visit: Payer: Self-pay

## 2021-12-19 DIAGNOSIS — O219 Vomiting of pregnancy, unspecified: Secondary | ICD-10-CM

## 2021-12-19 MED ORDER — PROMETHAZINE HCL 25 MG PO TABS
25.0000 mg | ORAL_TABLET | Freq: Four times a day (QID) | ORAL | 0 refills | Status: AC | PRN
Start: 2021-12-19 — End: ?

## 2021-12-26 ENCOUNTER — Other Ambulatory Visit: Payer: Self-pay

## 2021-12-26 ENCOUNTER — Ambulatory Visit: Payer: Medicaid Other | Attending: Family Medicine

## 2021-12-26 DIAGNOSIS — Z363 Encounter for antenatal screening for malformations: Secondary | ICD-10-CM | POA: Diagnosis present

## 2021-12-26 DIAGNOSIS — Z3A19 19 weeks gestation of pregnancy: Secondary | ICD-10-CM | POA: Insufficient documentation

## 2021-12-26 DIAGNOSIS — Z3490 Encounter for supervision of normal pregnancy, unspecified, unspecified trimester: Secondary | ICD-10-CM

## 2021-12-26 DIAGNOSIS — Z8616 Personal history of COVID-19: Secondary | ICD-10-CM | POA: Insufficient documentation

## 2021-12-30 ENCOUNTER — Encounter: Payer: Medicaid Other | Admitting: Family Medicine

## 2022-07-25 IMAGING — US US OB COMP LESS 14 WK
1 series · 15 of 28 positions shown · non-contrast
Comparison: None.

CLINICAL DATA: Rule out ectopic

EXAM:
OBSTETRIC <14 WK US AND TRANSVAGINAL OB US
TECHNIQUE: Both transabdominal and transvaginal ultrasound examinations were
performed for complete evaluation of the gestation as well as the
maternal uterus, adnexal regions, and pelvic cul-de-sac.
Transvaginal technique was performed to assess early pregnancy.

[Series 1: us ob comp less 14 wks mc & wl · 87 acquisitions, 15 frames shown]
[im 1/87]
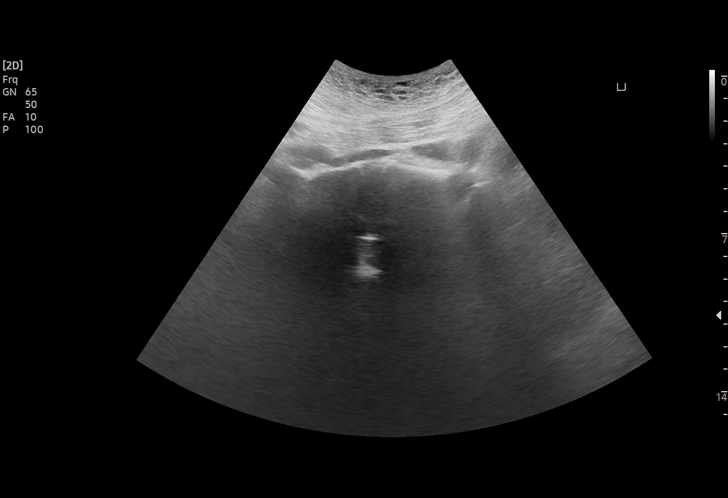
[im 7/87]
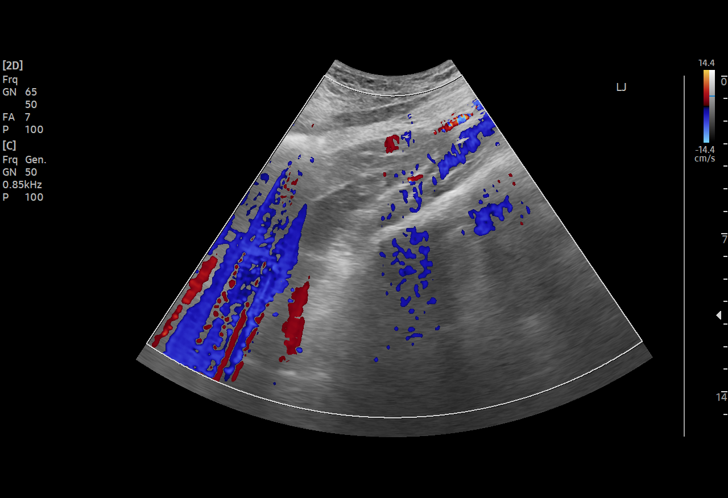
[im 13/87]
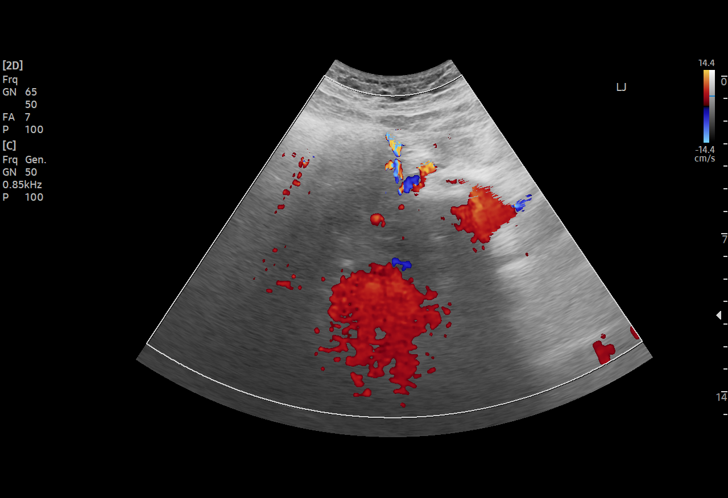
[im 20/87]
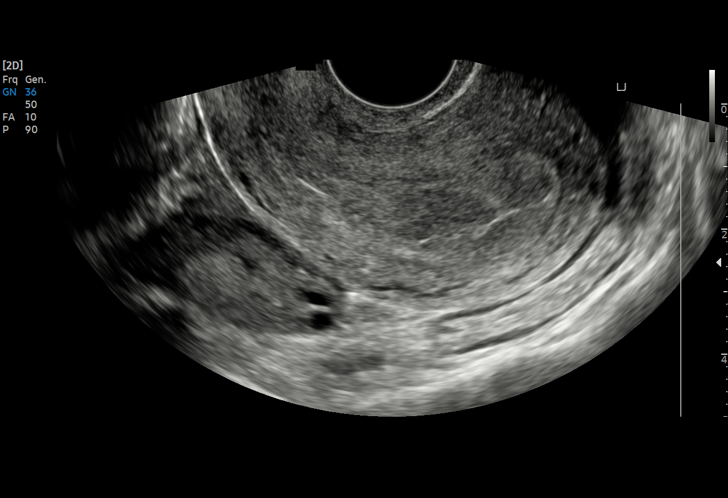
[im 26/87]
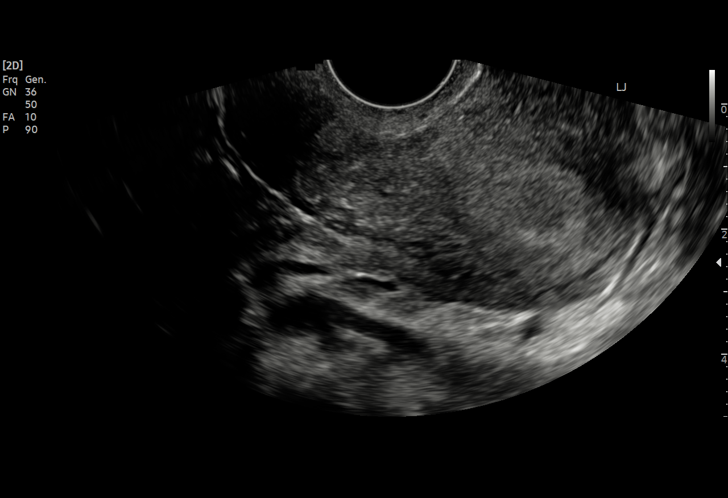
[im 32/87]
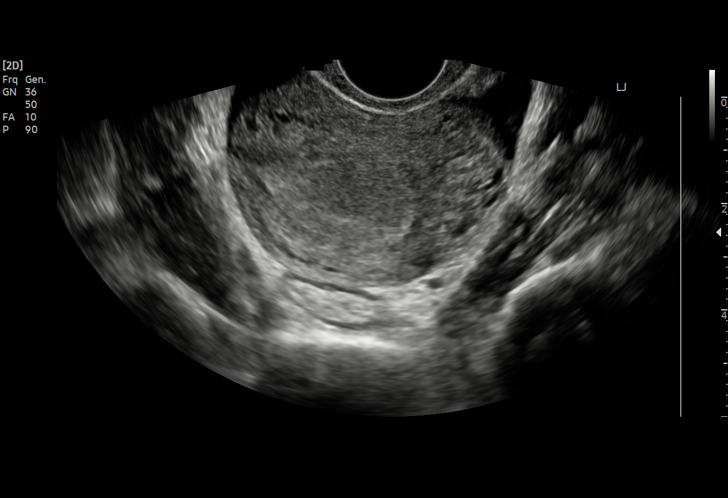
[im 39/87]
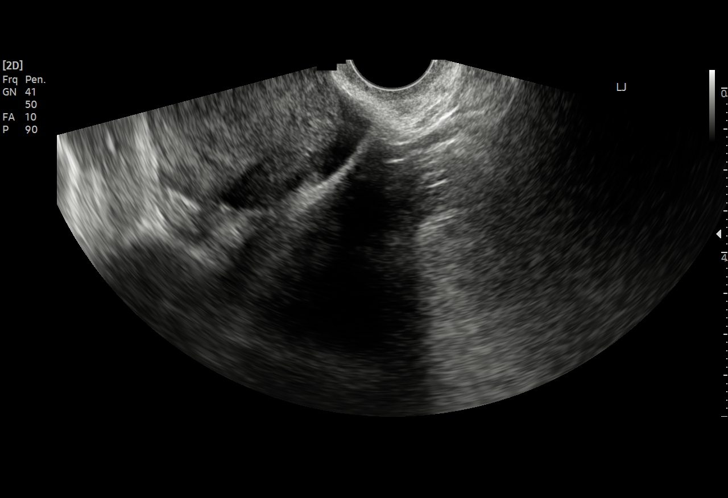
[im 45/87]
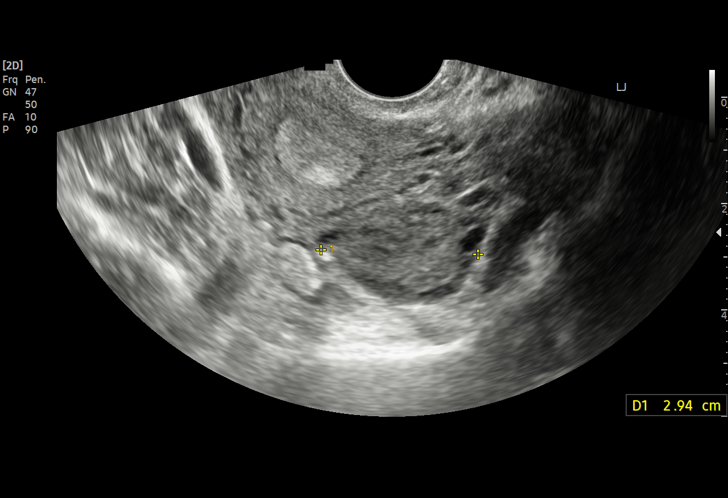
[im 48/87]
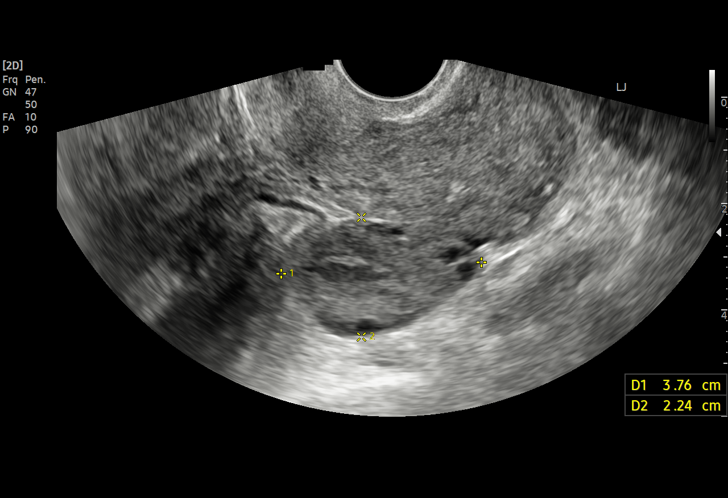
[im 55/87]
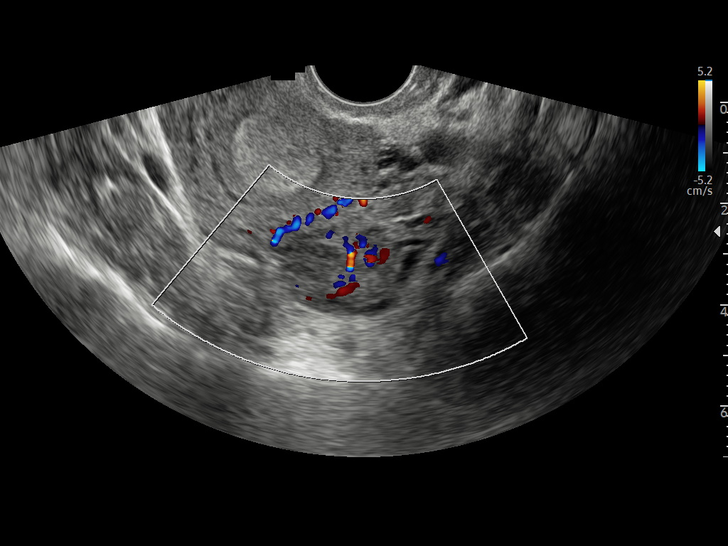
[im 61/87]
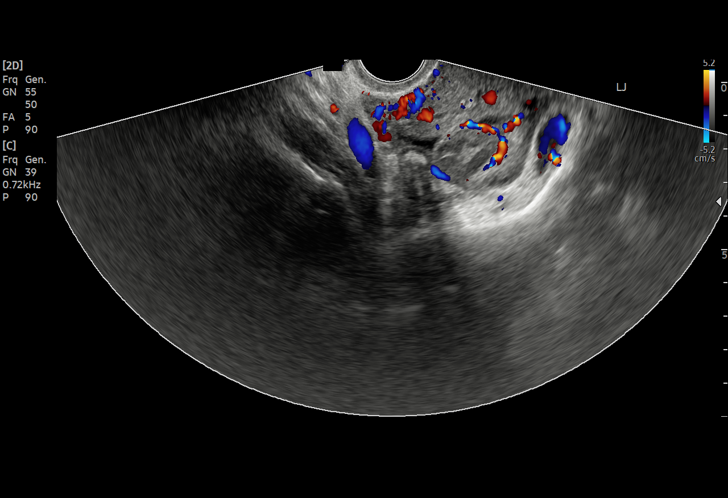
[im 67/87]
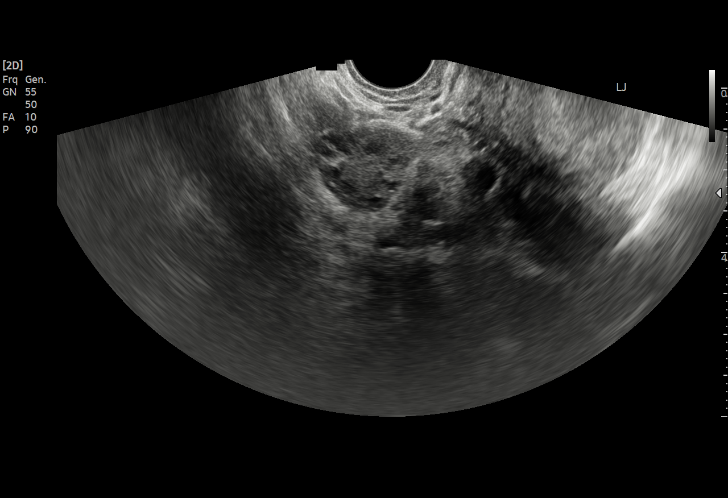
[im 74/87]
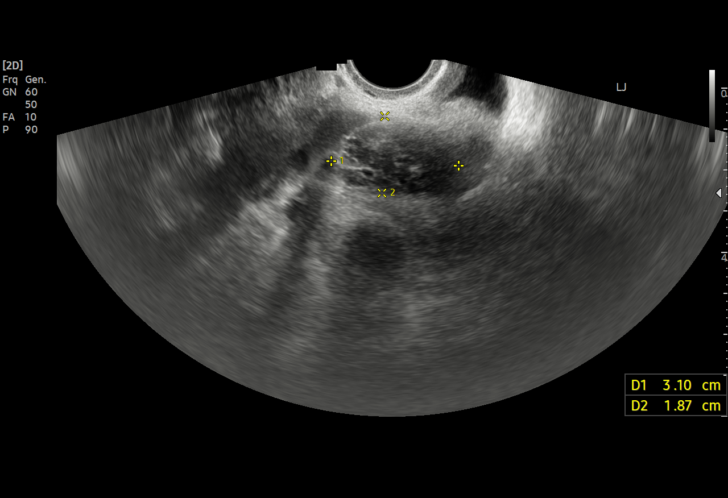
[im 80/87]
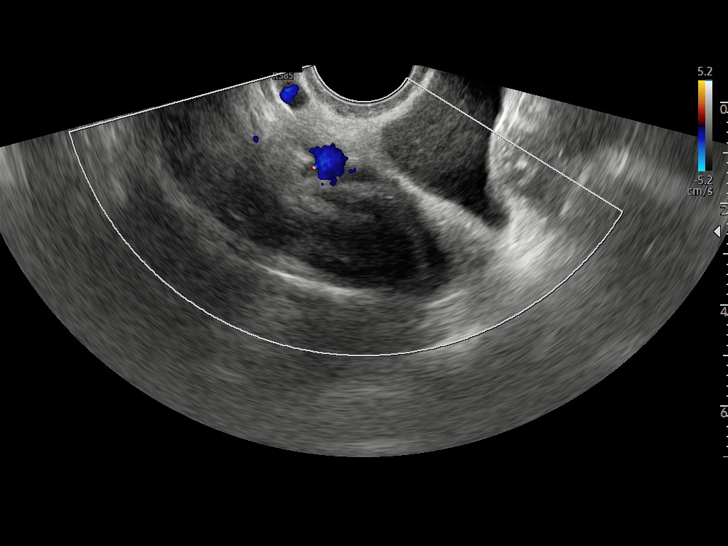
[im 87/87]
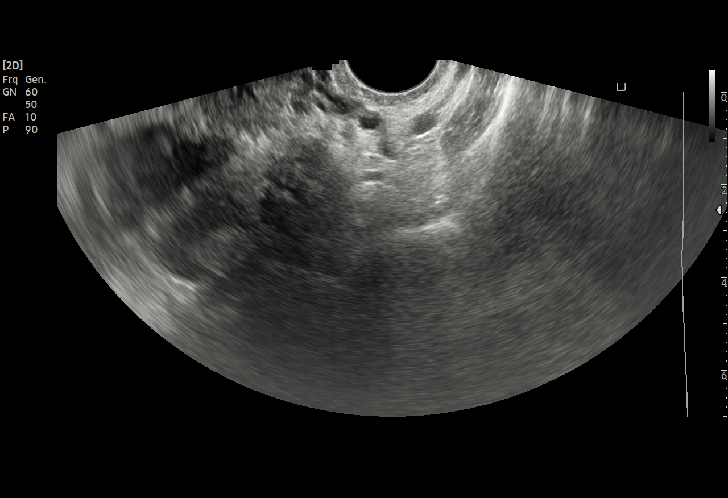

[15 of 28 positions shown; findings below may reference images not displayed]

FINDINGS: Intrauterine gestational sac: None

Yolk sac:  Not Visualized.

Embryo:  Not Visualized.

Cardiac Activity: Not Visualized.

Heart Rate: NA

Subchorionic hemorrhage:  None visualized.

Maternal uterus/adnexae: Solid lesion of the right adnexa measuring
3.1 x 1.9 x 1.9 cm which is separate from the right ovary and
demonstrates no internal blood flow. Heterogeneous area is seen in
the left ovary with peripheral color Doppler flow measuring 1.9 x
1.1 x 1.7 cm, likely corpus luteum cyst.
IMPRESSION: Solid hypoechoic mass of the right adnexa measuring 3.1 x 1.9 x
cm which is separate from the right ovary with no intrauterine
gestational sac, yolk sac, or fetal pole identified. In the setting
of positive pregnancy test and no definite intrauterine pregnancy,
findings are concerning for ectopic pregnancy. Recommend follow-up
with serial beta HCG and repeat sonography.

Results were called by telephone at the time of interpretation on
09/12/2021 at [DATE] to provider RTOYOTA JOSHJAX , who verbally
acknowledged these results.

## 2022-08-01 IMAGING — US US OB < 14 WEEKS - US OB TV
1 series · 15 of 28 positions shown · non-contrast
Comparison: None.

CLINICAL DATA: Pelvic pain

EXAM:
OBSTETRIC <14 WK US AND TRANSVAGINAL OB US
TECHNIQUE: Both transabdominal and transvaginal ultrasound examinations were
performed for complete evaluation of the gestation as well as the
maternal uterus, adnexal regions, and pelvic cul-de-sac.
Transvaginal technique was performed to assess early pregnancy.

[Series 1: us ob comp less 14 wks mc & wl · 100 acquisitions, 15 frames shown]
[im 1/100]
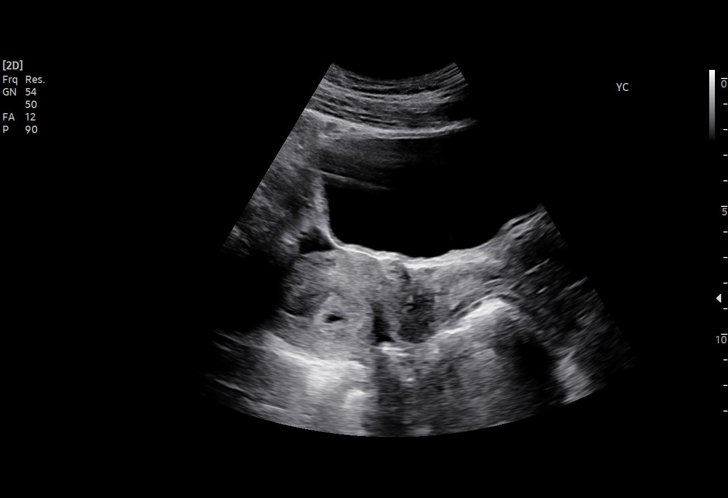
[im 8/100]
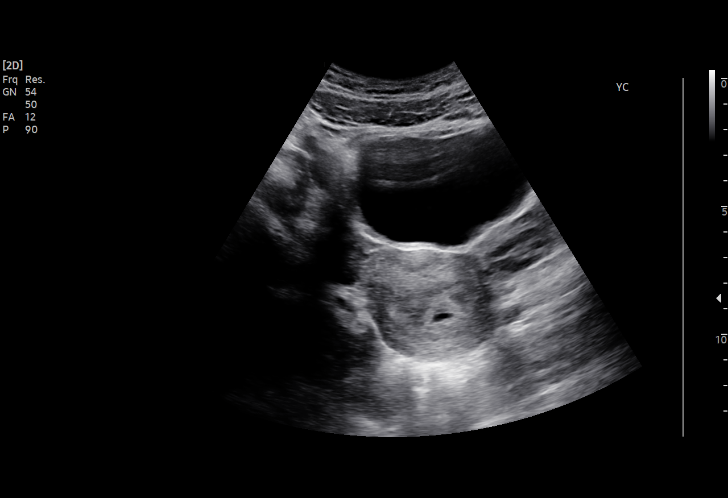
[im 15/100]
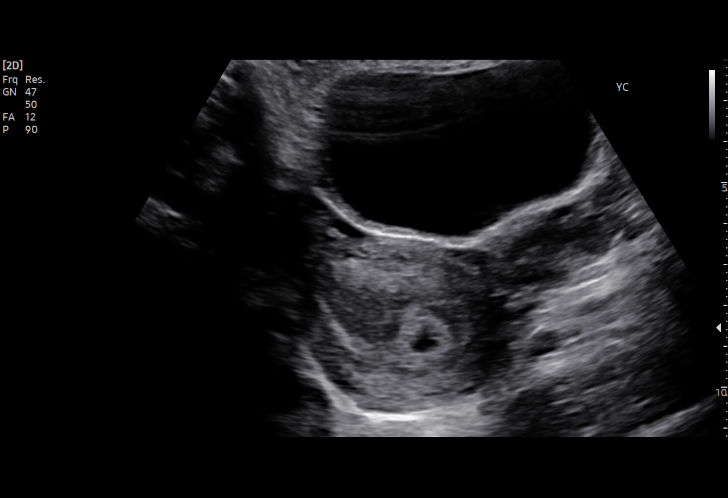
[im 23/100]
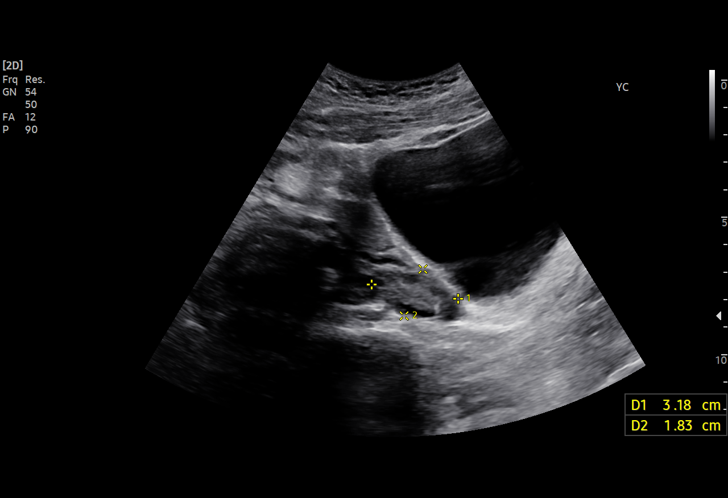
[im 30/100]
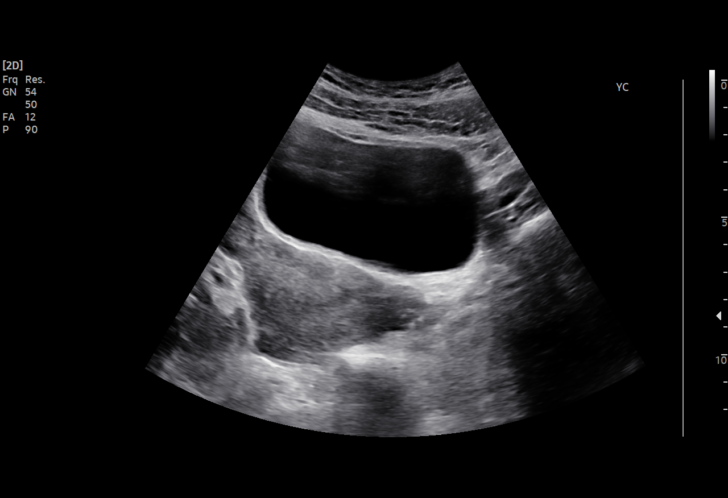
[im 37/100]
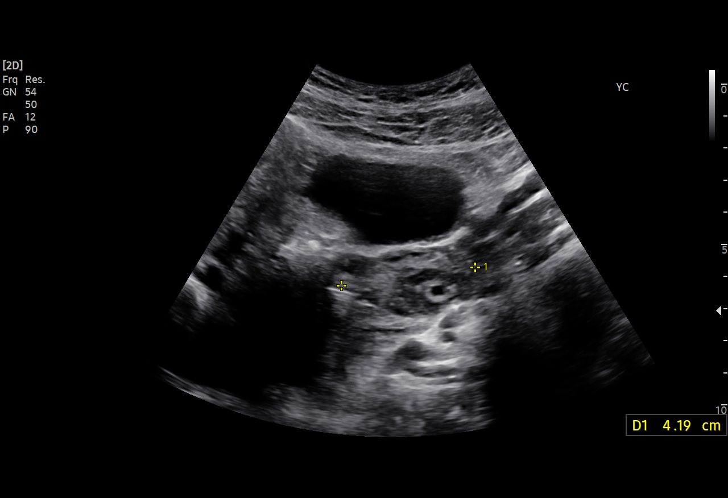
[im 45/100]
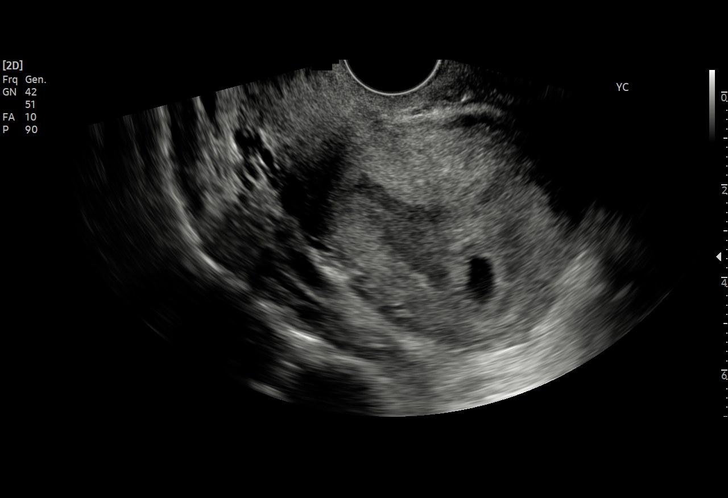
[im 52/100]
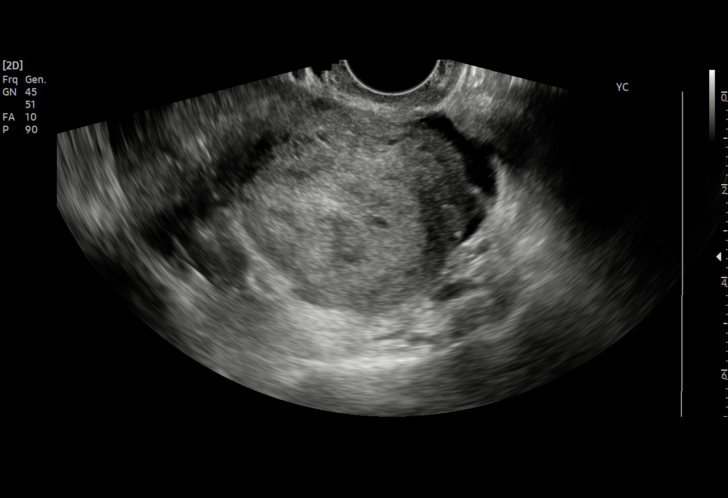
[im 56/100]
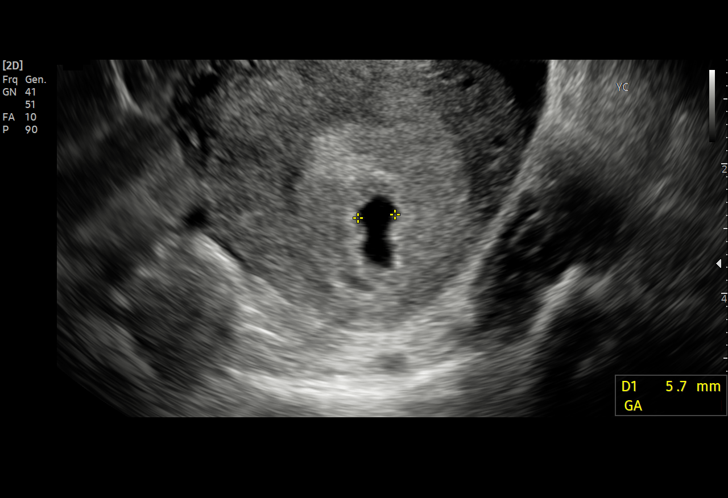
[im 63/100]
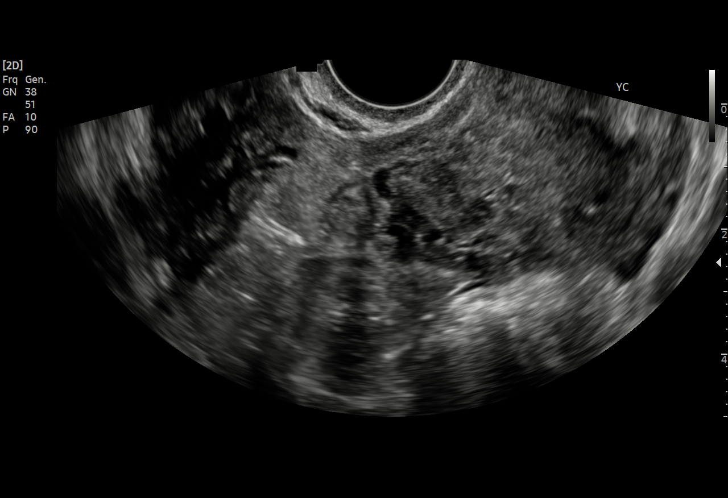
[im 70/100]
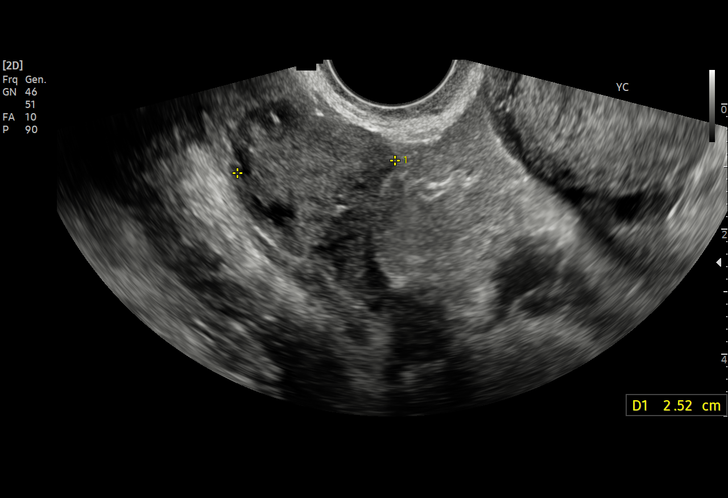
[im 78/100]
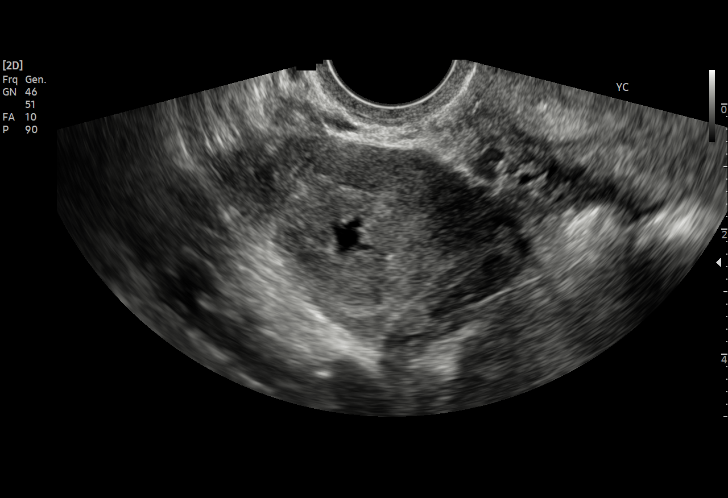
[im 85/100]
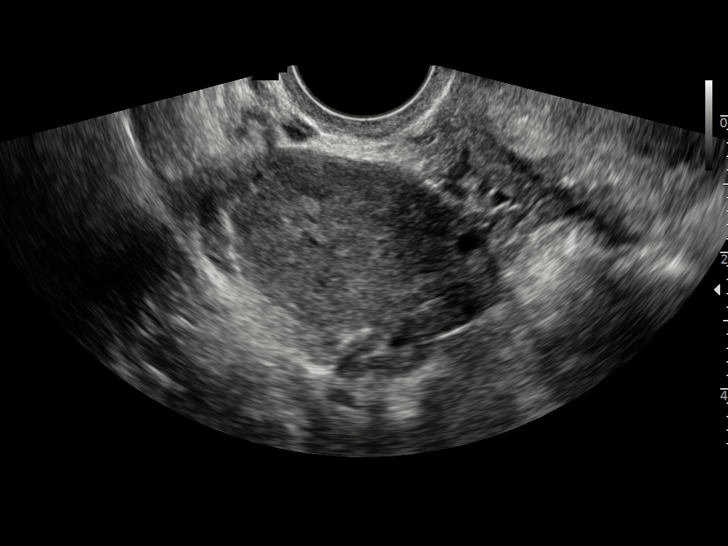
[im 92/100]
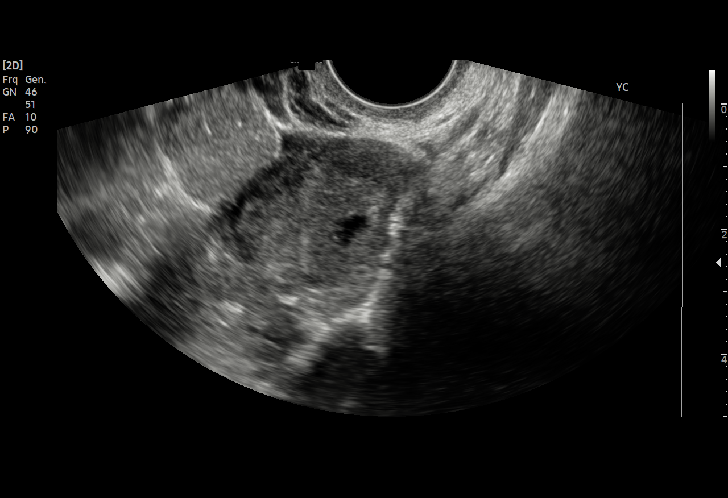
[im 100/100]
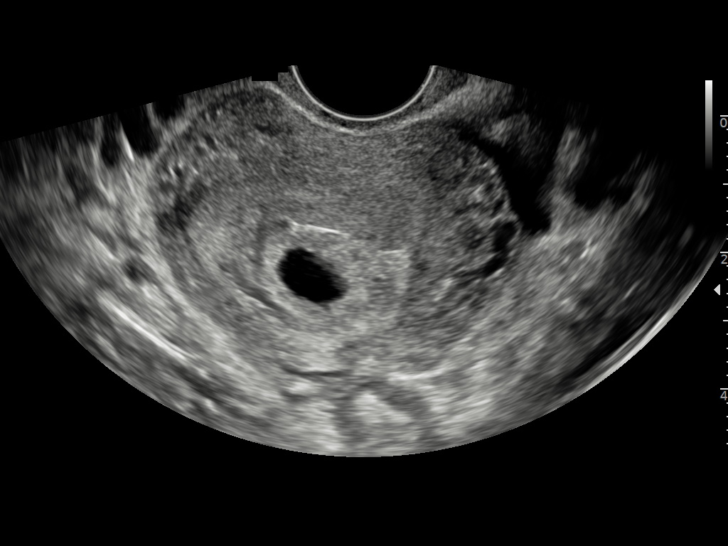

[15 of 28 positions shown; findings below may reference images not displayed]

FINDINGS: Intrauterine gestational sac: Single

Yolk sac:  Not Visualized.

Embryo:  Not Visualized.

MSD: 6.2  mm   5 w   2  d

Subchorionic hemorrhage:  None visualized.

Maternal uterus/adnexae: Ring like structure in the left adnexa
measuring up to 3.1 cm is definitely intra-ovarian and consistent
with corpus luteum
IMPRESSION: Probable early intrauterine gestational sac, but no yolk sac or
fetal pole. Recommend follow-up quantitative B-HCG levels and
follow-up US in 14 days to assess viability. This recommendation
follows SRU consensus guidelines: Diagnostic Criteria for Nonviable
Pregnancy Early in the First Trimester. N Engl J Med 8902;
